# Patient Record
Sex: Female | Born: 2000 | Race: White | Hispanic: No | Marital: Single | Smoking: Never smoker
Health system: Southern US, Community
[De-identification: ages and names within clinical notes are randomized; demographics above are authoritative.]

## PROBLEM LIST (undated history)

## (undated) DIAGNOSIS — G90A Postural orthostatic tachycardia syndrome (POTS): Secondary | ICD-10-CM

## (undated) DIAGNOSIS — I951 Orthostatic hypotension: Secondary | ICD-10-CM

## (undated) DIAGNOSIS — Q796 Ehlers-Danlos syndrome, unspecified: Secondary | ICD-10-CM

## (undated) DIAGNOSIS — R000 Tachycardia, unspecified: Secondary | ICD-10-CM

## (undated) DIAGNOSIS — N83209 Unspecified ovarian cyst, unspecified side: Secondary | ICD-10-CM

## (undated) DIAGNOSIS — D649 Anemia, unspecified: Secondary | ICD-10-CM

## (undated) DIAGNOSIS — G43909 Migraine, unspecified, not intractable, without status migrainosus: Secondary | ICD-10-CM

## (undated) DIAGNOSIS — I498 Other specified cardiac arrhythmias: Secondary | ICD-10-CM

---
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
## 2021-08-22 ENCOUNTER — Emergency Department (INDEPENDENT_AMBULATORY_CARE_PROVIDER_SITE_OTHER)
Admission: RE | Admit: 2021-08-22 | Discharge: 2021-08-22 | Disposition: A | Discharge status: Home / Self Care | Payer: BC Managed Care – PPO | Source: Ambulatory Visit | Attending: Family Medicine | Admitting: Family Medicine

## 2021-08-22 ENCOUNTER — Other Ambulatory Visit: Payer: Self-pay

## 2021-08-22 VITALS — BP 114/78 | HR 107 | Temp 99.4°F | Resp 20 | Ht 69.0 in | Wt 170.0 lb

## 2021-08-22 DIAGNOSIS — J039 Acute tonsillitis, unspecified: Secondary | ICD-10-CM

## 2021-08-22 HISTORY — DX: Postural orthostatic tachycardia syndrome (POTS): G90.A

## 2021-08-22 LAB — POCT RAPID STREP A (OFFICE): Rapid Strep A Screen: NEGATIVE

## 2021-08-22 LAB — POC SARS CORONAVIRUS 2 AG -  ED: SARS Coronavirus 2 Ag: NEGATIVE

## 2021-08-22 MED ORDER — FLUCONAZOLE 150 MG PO TABS: 150.0000 mg | ORAL_TABLET | Freq: Every day | ORAL | 0 refills | Status: AC

## 2021-08-22 MED ORDER — AMOXICILLIN-POT CLAVULANATE 875-125 MG PO TABS: 1.0000 | ORAL_TABLET | Freq: Two times a day (BID) | ORAL | 0 refills | Status: AC

## 2021-08-22 NOTE — Discharge Instructions (Addendum)
Take the Augmentin 2 times a day. Take a probiotic while you are on Augmentin to prevent stomach upset Take Diflucan if needed for any yeast infection symptoms Drink lots of fluids Check MyChart in 2 to 3 days for your throat culture report

## 2021-08-22 NOTE — ED Provider Notes (Signed)
[facility]    CSN: [id number] Arrival date & time: 08/22/21  1140      History   Chief Complaint Chief Complaint  Patient presents with   Sore Throat    Sore throat, headache, and cough X2 days    HPI Maria Ibarra is a 21 y.o. female.   HPI Sore throat headache and cough for 2 to 3 days.  Throat is very painful.  Can hardly swallow.  Having trouble eating.  Has had tonsillitis in the past.  No known exposure to strep COVID or influenza. Is leaving for cruise tomorrow.  Will be [place] of the country [place] a [place] Past Medical History:  Diagnosis Date   POTS (postural orthostatic tachycardia syndrome)     There are no problems to display for this patient.   History reviewed. No pertinent surgical history.  OB History   No obstetric history on file.      Home Medications    Prior to Admission medications   Medication Sig Start Date End Date Taking? Authorizing Provider  amoxicillin-clavulanate (AUGMENTIN) 875-125 MG tablet Take 1 tablet by mouth every 12 (twelve) hours. 08/22/21  Yes Raylene Everts, MD  escitalopram (LEXAPRO) 20 MG tablet Take 20 mg by mouth daily. 08/17/21  Yes [provider]  fluconazole (DIFLUCAN) 150 MG tablet Take 1 tablet (150 mg total) by mouth daily. Repeat in 1 week if needed 08/22/21  Yes Raylene Everts, MD  JUNEL FE 24 1-20 MG-MCG(24) tablet Take 1 tablet by mouth daily. 07/06/21  Yes [provider]  propranolol (INDERAL) 10 MG tablet Take 10 mg by mouth 3 (three) times daily. 07/22/21  Yes [provider]  pyridostigmine (MESTINON) 60 MG tablet Take 60 mg by mouth 2 (two) times daily. 07/22/21  Yes [provider]    Family History History reviewed. No pertinent family history.  Social History Social History   Tobacco Use   Smoking status: Never   Smokeless tobacco: Never  Substance Use Topics   Alcohol use: Yes    Comment: occ     Allergies   Patient has no known  allergies.   Review of Systems Review of Systems See HPI  Physical Exam Triage Vital Signs ED Triage Vitals  Enc Vitals Group     BP 08/22/21 1227 114/78     Pulse Rate 08/22/21 1227 (!) 107     Resp 08/22/21 1227 20     Temp 08/22/21 1227 99.4 F (37.4 C)     Temp Source 08/22/21 1227 Oral     SpO2 08/22/21 1227 96 %     Weight 08/22/21 1223 170 lb (77.1 kg)     Height 08/22/21 1223 5\' 9"  (1.753 m)     Head Circumference --      Peak Flow --      Pain Score 08/22/21 1223 8     Pain Loc --      Pain Edu? --      Excl. in [place]? --    No data found.  Updated Vital Signs BP 114/78    Pulse (!) 107    Temp 99.4 F (37.4 C) (Oral)    Resp 20    Ht 5\' 9"  (1.753 m)    Wt 77.1 kg    LMP 08/15/2021    SpO2 96%    BMI 25.10 kg/m      Physical Exam Constitutional:      General: She is not in acute distress.  Appearance: She is well-developed.  HENT:     Head: Normocephalic and atraumatic.     Right Ear: Tympanic membrane and ear canal normal.     Nose: No congestion or rhinorrhea.     Mouth/Throat:     Mouth: Mucous membranes are moist.     Pharynx: Uvula midline. Pharyngeal swelling and posterior oropharyngeal erythema present.     Tonsils: Tonsillar exudate present. No tonsillar abscesses. 3+ on the right. 3+ on the left.  Eyes:     Conjunctiva/sclera: Conjunctivae normal.     Pupils: Pupils are equal, round, and reactive to light.  Cardiovascular:     Rate and Rhythm: Normal rate and regular rhythm.  Pulmonary:     Effort: Pulmonary effort is normal. No respiratory distress.     Breath sounds: Normal breath sounds.  Abdominal:     General: There is no distension.     Palpations: Abdomen is soft.  Musculoskeletal:        General: Normal range of motion.     Cervical back: Normal range of motion.  Lymphadenopathy:     Cervical: Cervical adenopathy present.  Skin:    General: Skin is warm and dry.  Neurological:     Mental Status: She is alert.  Psychiatric:         Mood and Affect: Mood normal.        Behavior: Behavior normal.     UC Treatments / Results  Labs (all labs ordered are listed, but only abnormal results are displayed) Labs Reviewed  CULTURE, GROUP A STREP  POCT RAPID STREP A (OFFICE)  POC SARS CORONAVIRUS 2 AG -  ED    EKG   Radiology No results found.  Procedures Procedures (including critical care time)  Medications Ordered in UC Medications - No data to display  Initial Impression / Assessment and Plan / UC Course  I have reviewed the triage vital signs and the nursing notes.  Pertinent labs & imaging results that were available during my care of the patient were reviewed by me and considered in my medical decision making (see chart for details).     Patient has large swelling tonsils with exudate.  Throat pain.  Since she is going out of the country and going to cover with antibiotics, she will be unable to obtain them once on her cruise.  She knows that she should stop the antibiotics early if her throat culture is negative Final Clinical Impressions(s) / UC Diagnoses   Final diagnoses:  Tonsillitis     Discharge Instructions      Take the Augmentin 2 times a day. Take a probiotic while you are on Augmentin to prevent stomach upset Take Diflucan if needed for any yeast infection symptoms Drink lots of fluids Check MyChart in 2 to 3 days for your throat culture report     ED Prescriptions     Medication Sig Dispense Auth. Provider   amoxicillin-clavulanate (AUGMENTIN) 875-125 MG tablet Take 1 tablet by mouth every 12 (twelve) hours. 14 tablet Raylene Everts, MD   fluconazole (DIFLUCAN) 150 MG tablet Take 1 tablet (150 mg total) by mouth daily. Repeat in 1 week if needed 2 tablet Raylene Everts, MD      PDMP not reviewed this encounter.   Raylene Everts, MD 08/22/21 [phone]

## 2021-08-22 NOTE — ED Triage Notes (Signed)
Pt states that she has a sore throat, headache, and cough. X2 days  Pt states that she is vaccinated for covid. Pt states that she hasn't had flu vaccine.

## 2021-08-26 LAB — CULTURE, GROUP A STREP
